# Patient Record
Sex: Male | Born: 1965 | Hispanic: No | Marital: Married | Smoking: Never smoker
Health system: Southern US, Community
[De-identification: ages and names within clinical notes are randomized; demographics above are authoritative.]

## PROBLEM LIST (undated history)

## (undated) DIAGNOSIS — M1990 Unspecified osteoarthritis, unspecified site: Secondary | ICD-10-CM

## (undated) DIAGNOSIS — T7840XA Allergy, unspecified, initial encounter: Secondary | ICD-10-CM

## (undated) DIAGNOSIS — L02419 Cutaneous abscess of limb, unspecified: Secondary | ICD-10-CM

## (undated) DIAGNOSIS — E079 Disorder of thyroid, unspecified: Secondary | ICD-10-CM

## (undated) DIAGNOSIS — F419 Anxiety disorder, unspecified: Secondary | ICD-10-CM

## (undated) HISTORY — DX: Disorder of thyroid, unspecified: E07.9

## (undated) HISTORY — DX: Cutaneous abscess of limb, unspecified: L02.419

## (undated) HISTORY — DX: Allergy, unspecified, initial encounter: T78.40XA

---

## 1989-11-20 HISTORY — PX: TONSILLECTOMY: SUR1361

## 2003-11-12 ENCOUNTER — Encounter: Admission: RE | Admit: 2003-11-12 | Discharge: 2003-11-12 | Payer: Self-pay | Admitting: Family Medicine

## 2010-12-10 ENCOUNTER — Encounter: Payer: Self-pay | Admitting: Family Medicine

## 2010-12-12 ENCOUNTER — Encounter: Payer: Self-pay | Admitting: Family Medicine

## 2011-06-08 ENCOUNTER — Encounter (INDEPENDENT_AMBULATORY_CARE_PROVIDER_SITE_OTHER): Payer: Self-pay | Admitting: General Surgery

## 2011-06-09 ENCOUNTER — Encounter (INDEPENDENT_AMBULATORY_CARE_PROVIDER_SITE_OTHER): Payer: Self-pay | Admitting: General Surgery

## 2011-06-13 ENCOUNTER — Ambulatory Visit (INDEPENDENT_AMBULATORY_CARE_PROVIDER_SITE_OTHER): Payer: BC Managed Care – PPO | Admitting: General Surgery

## 2011-06-13 ENCOUNTER — Encounter (INDEPENDENT_AMBULATORY_CARE_PROVIDER_SITE_OTHER): Payer: Self-pay | Admitting: General Surgery

## 2011-06-13 VITALS — BP 134/102 | HR 88 | Temp 97.8°F

## 2011-06-13 DIAGNOSIS — L03119 Cellulitis of unspecified part of limb: Secondary | ICD-10-CM

## 2011-06-13 DIAGNOSIS — L02415 Cutaneous abscess of right lower limb: Secondary | ICD-10-CM

## 2011-06-13 NOTE — Patient Instructions (Signed)
Resume normal activities and a few habitus small injury clean at all from applied topical antibiotic ointment or bed.  A few developed an infection start taken to [facility] but call for an appointment to be seen in the urgent office. Antibiotics are helpful but abscess requires and the I&D

## 2011-06-13 NOTE — Progress Notes (Signed)
Subjective:     Patient ID: Darren Lewis., male   DOB: 1966/11/07, 45 y.o.   MRN: [id number]  HPI [name] returns he is a patient that I sawapproximately 2 months ago when he had an abscess drained in the right thigh it did grow MRSA area he originally been on dotched him ove renewed to [facility] and he completed that approximately 2 weeks ago there is no evidence of any open wound or drainage or tenderness at this time because has a little sterile redness from the healing phase of his previous large abscess   Review of Systems No changes in his review of systems he is not started [place].     Objective:   Physical Exam Examination is limited to the right lower extremity shows a very small healing area with no drainage and no tenderness full subcutaneous mass did not think he developed any inguinal lymphadenopathy in the area appears healed .  I discussed with him that some people might consider a nose culture looking for MRSA wait until he has another problem infection before ordering that because of the cost. I did give him a prescription for [facility] that he will not get filled but if he does have another problem infection we'll start antibiotics and call to be seen in an urgent office promptly     Assessment:         Plan:     Turn to see [place] if her health as needed if you do get a small abrasion or injury cleaned off his feet with Betadine and Neosporin as you would do an injury.

## 2011-06-16 ENCOUNTER — Encounter (INDEPENDENT_AMBULATORY_CARE_PROVIDER_SITE_OTHER): Payer: Self-pay | Admitting: General Surgery

## 2011-11-20 ENCOUNTER — Ambulatory Visit (INDEPENDENT_AMBULATORY_CARE_PROVIDER_SITE_OTHER): Payer: BC Managed Care – PPO

## 2011-11-20 DIAGNOSIS — J40 Bronchitis, not specified as acute or chronic: Secondary | ICD-10-CM

## 2011-11-20 DIAGNOSIS — R5381 Other malaise: Secondary | ICD-10-CM

## 2011-11-20 DIAGNOSIS — R05 Cough: Secondary | ICD-10-CM

## 2011-11-21 HISTORY — PX: INCISION AND DRAINAGE ABSCESS / HEMATOMA OF BURSA / KNEE / THIGH: SUR668

## 2018-03-12 ENCOUNTER — Ambulatory Visit: Payer: Self-pay | Admitting: Orthopedic Surgery

## 2018-04-17 ENCOUNTER — Ambulatory Visit: Payer: Self-pay | Admitting: Orthopedic Surgery

## 2018-04-17 NOTE — H&P (Signed)
TOTAL KNEE ADMISSION H&P  Patient is being admitted for left total knee arthroplasty.  Subjective:  Chief Complaint:left knee pain.  HPI: Darren Vayda., 52 y.o. male, has a history of pain and functional disability in the left knee due to arthritis and has failed non-surgical conservative treatments for greater than 12 weeks to includeNSAID's and/or analgesics, corticosteriod injections, flexibility and strengthening excercises, use of assistive devices, weight reduction as appropriate and activity modification.  Onset of symptoms was gradual, starting >10 years ago with gradually worsening course since that time. The patient noted no past surgery on the left knee(s).  Patient currently rates pain in the left knee(s) at 10 out of 10 with activity. Patient has night pain, worsening of pain with activity and weight bearing, pain that interferes with activities of daily living, pain with passive range of motion, crepitus and joint swelling.  Patient has evidence of subchondral cysts, subchondral sclerosis, periarticular osteophytes and joint space narrowing by imaging studies.  There is no active infection.  There are no active problems to display for this patient.  Past Medical History:  Diagnosis Date  . Allergy   . Leg abscess   . Thyroid disease     Past Surgical History:  Procedure Laterality Date  . INCISION AND DRAINAGE ABSCESS / HEMATOMA OF BURSA / KNEE / THIGH     right  . TONSILLECTOMY      Current Outpatient Medications  Medication Sig Dispense Refill Last Dose  . doxycycline (DORYX) 100 MG DR capsule Take 100 mg by mouth 2 (two) times daily.     Not Taking   No current facility-administered medications for this visit.    No Known Allergies  Social History   Tobacco Use  . Smoking status: Never Smoker  . Smokeless tobacco: Never Used  Substance Use Topics  . Alcohol use: Yes    Alcohol/week: 0.0 oz    Types: 2 - 3 Cans of beer per week    No family history on  file.   Review of Systems  Constitutional: Negative.   HENT: Negative.   Eyes: Negative.   Respiratory: Negative.   Cardiovascular: Negative.   Gastrointestinal: Negative.   Genitourinary: Negative.   Musculoskeletal: Positive for joint pain.  Skin: Negative.   Neurological: Negative.   Endo/Heme/Allergies: Negative.   Psychiatric/Behavioral: Negative.     Objective:  Physical Exam  Vitals reviewed. Constitutional: He is oriented to person, place, and time. He appears well-developed and well-nourished.  HENT:  Head: Normocephalic and atraumatic.  Eyes: Pupils are equal, round, and reactive to light. Conjunctivae and EOM are normal.  Neck: Normal range of motion. Neck supple.  Cardiovascular: Normal rate, regular rhythm and intact distal pulses.  Respiratory: Effort normal. No respiratory distress.  GI: Soft. He exhibits no distension.  Genitourinary:  Genitourinary Comments: deferred  Musculoskeletal:       Left knee: He exhibits decreased range of motion, swelling, effusion and abnormal alignment. Tenderness found. Medial joint line and lateral joint line tenderness noted.  Neurological: He is alert and oriented to person, place, and time. He has normal reflexes.  Skin: Skin is warm and dry.  Psychiatric: He has a normal mood and affect. His behavior is normal. Judgment and thought content normal.    Vital signs in last 24 hours: @  Labs:   There is no height or weight on file to calculate BMI.   Imaging Review Plain radiographs demonstrate severe degenerative joint disease of the left knee(s). The overall  alignment issignificant valgus. The bone quality appears to be adequate for age and reported activity level.   Preoperative templating of the joint replacement has been completed, documented, and submitted to the Operating Room personnel in order to optimize intra-operative equipment management.    Patient's anticipated LOS is less than 2 midnights,  meeting these requirements: - Younger than 65 - Lives within 1 hour of care - Has a competent adult at home to recover with post-op recover - NO history of  - Chronic pain requiring opiods  - Diabetes  - Coronary Artery Disease  - Heart failure  - Heart attack  - Stroke  - DVT/VTE  - Cardiac arrhythmia  - Respiratory Failure/COPD  - Renal failure  - Anemia  - Advanced Liver disease        Assessment/Plan:  End stage arthritis, left knee   The patient history, physical examination, clinical judgment of the provider and imaging studies are consistent with end stage degenerative joint disease of the left knee(s) and total knee arthroplasty is deemed medically necessary. The treatment options including medical management, injection therapy arthroscopy and arthroplasty were discussed at length. The risks and benefits of total knee arthroplasty were presented and reviewed. The risks due to aseptic loosening, infection, stiffness, patella tracking problems, thromboembolic complications and other imponderables were discussed. The patient acknowledged the explanation, agreed to proceed with the plan and consent was signed. Patient is being admitted for inpatient treatment for surgery, pain control, PT, OT, prophylactic antibiotics, VTE prophylaxis, progressive ambulation and ADL's and discharge planning. The patient is planning to be discharged home with outpatient PT at [facility]. Rx for DME given.  

## 2018-04-17 NOTE — H&P (View-Only) (Signed)
TOTAL KNEE ADMISSION H&P  Patient is being admitted for left total knee arthroplasty.  Subjective:  Chief Complaint:left knee pain.  HPI: Darren Vayda., 52 y.o. male, has a history of pain and functional disability in the left knee due to arthritis and has failed non-surgical conservative treatments for greater than 12 weeks to includeNSAID's and/or analgesics, corticosteriod injections, flexibility and strengthening excercises, use of assistive devices, weight reduction as appropriate and activity modification.  Onset of symptoms was gradual, starting >10 years ago with gradually worsening course since that time. The patient noted no past surgery on the left knee(s).  Patient currently rates pain in the left knee(s) at 10 out of 10 with activity. Patient has night pain, worsening of pain with activity and weight bearing, pain that interferes with activities of daily living, pain with passive range of motion, crepitus and joint swelling.  Patient has evidence of subchondral cysts, subchondral sclerosis, periarticular osteophytes and joint space narrowing by imaging studies.  There is no active infection.  There are no active problems to display for this patient.  Past Medical History:  Diagnosis Date  . Allergy   . Leg abscess   . Thyroid disease     Past Surgical History:  Procedure Laterality Date  . INCISION AND DRAINAGE ABSCESS / HEMATOMA OF BURSA / KNEE / THIGH     right  . TONSILLECTOMY      Current Outpatient Medications  Medication Sig Dispense Refill Last Dose  . doxycycline (DORYX) 100 MG DR capsule Take 100 mg by mouth 2 (two) times daily.     Not Taking   No current facility-administered medications for this visit.    No Known Allergies  Social History   Tobacco Use  . Smoking status: Never Smoker  . Smokeless tobacco: Never Used  Substance Use Topics  . Alcohol use: Yes    Alcohol/week: 0.0 oz    Types: 2 - 3 Cans of beer per week    No family history on  file.   Review of Systems  Constitutional: Negative.   HENT: Negative.   Eyes: Negative.   Respiratory: Negative.   Cardiovascular: Negative.   Gastrointestinal: Negative.   Genitourinary: Negative.   Musculoskeletal: Positive for joint pain.  Skin: Negative.   Neurological: Negative.   Endo/Heme/Allergies: Negative.   Psychiatric/Behavioral: Negative.     Objective:  Physical Exam  Vitals reviewed. Constitutional: He is oriented to person, place, and time. He appears well-developed and well-nourished.  HENT:  Head: Normocephalic and atraumatic.  Eyes: Pupils are equal, round, and reactive to light. Conjunctivae and EOM are normal.  Neck: Normal range of motion. Neck supple.  Cardiovascular: Normal rate, regular rhythm and intact distal pulses.  Respiratory: Effort normal. No respiratory distress.  GI: Soft. He exhibits no distension.  Genitourinary:  Genitourinary Comments: deferred  Musculoskeletal:       Left knee: He exhibits decreased range of motion, swelling, effusion and abnormal alignment. Tenderness found. Medial joint line and lateral joint line tenderness noted.  Neurological: He is alert and oriented to person, place, and time. He has normal reflexes.  Skin: Skin is warm and dry.  Psychiatric: He has a normal mood and affect. His behavior is normal. Judgment and thought content normal.    Vital signs in last 24 hours: @  Labs:   There is no height or weight on file to calculate BMI.   Imaging Review Plain radiographs demonstrate severe degenerative joint disease of the left knee(s). The overall  alignment issignificant valgus. The bone quality appears to be adequate for age and reported activity level.   Preoperative templating of the joint replacement has been completed, documented, and submitted to the Operating Room personnel in order to optimize intra-operative equipment management.    Patient's anticipated LOS is less than 2 midnights,  meeting these requirements: - Younger than 64 - Lives within 1 hour of care - Has a competent adult at home to recover with post-op recover - NO history of  - Chronic pain requiring opiods  - Diabetes  - Coronary Artery Disease  - Heart failure  - Heart attack  - Stroke  - DVT/VTE  - Cardiac arrhythmia  - Respiratory Failure/COPD  - Renal failure  - Anemia  - Advanced Liver disease        Assessment/Plan:  End stage arthritis, left knee   The patient history, physical examination, clinical judgment of the provider and imaging studies are consistent with end stage degenerative joint disease of the left knee(s) and total knee arthroplasty is deemed medically necessary. The treatment options including medical management, injection therapy arthroscopy and arthroplasty were discussed at length. The risks and benefits of total knee arthroplasty were presented and reviewed. The risks due to aseptic loosening, infection, stiffness, patella tracking problems, thromboembolic complications and other imponderables were discussed. The patient acknowledged the explanation, agreed to proceed with the plan and consent was signed. Patient is being admitted for inpatient treatment for surgery, pain control, PT, OT, prophylactic antibiotics, VTE prophylaxis, progressive ambulation and ADL's and discharge planning. The patient is planning to be discharged home with outpatient PT at [facility]. Rx for DME given.

## 2018-05-02 NOTE — Patient Instructions (Signed)
Tyrone Sagehomas S Kainz Jr.  05/02/2018   Your procedure is scheduled on: 05-09-18   [facility]    Report to admitting at 5:30AM    Call this number if you have problems the morning of surgery [phone]     Remember: Do not eat food or drink liquids :After Midnight.     Take these medicines the morning of surgery with A SIP OF WATER: LEVOTHYROXINE                                 You may not have any metal on your body including hair pins and              piercings  Do not wear jewelry, make-up, lotions, powders or perfumes, deodorant                   Men may shave face and neck.   Do not bring valuables to the hospital. [facility] IS NOT             RESPONSIBLE   FOR VALUABLES.  Contacts, dentures or bridgework may not be worn into surgery.  Leave suitcase in the car. After surgery it may be brought to your room.                Please read over the following fact sheets you were given: _____________________________________________________________________             [facility] - Preparing [facility] Before surgery, you can play an important role.  Because skin is not sterile, your skin needs to be as free of germs as possible.  You can reduce the number of germs on your skin by washing with CHG (chlorahexidine gluconate) soap before surgery.  CHG is an antiseptic cleaner which kills germs and bonds with the skin to continue killing germs even after washing. Please DO NOT use if you have an allergy to CHG or antibacterial soaps.  If your skin becomes reddened/irritated stop using the CHG and inform your nurse when you arrive at Short Stay. Do not shave (including legs and underarms) for at least 48 hours prior to the first CHG shower.  You may shave your face/neck. Please follow these instructions carefully:  1.  Shower with CHG Soap the night before surgery and the  morning of Surgery.  2.  If you choose to wash your hair, wash  your hair first as usual with your  normal  shampoo.  3.  After you shampoo, rinse your hair and body thoroughly to remove the  shampoo.                           4.  Use CHG as you would any other liquid soap.  You can apply chg directly  to the skin and wash                       Gently with a scrungie or clean washcloth.  5.  Apply the CHG Soap to your body ONLY FROM THE NECK DOWN.   Do not use on face/ open                           Wound  or open sores. Avoid contact with eyes, ears mouth and genitals (private parts).                       Wash face,  Genitals (private parts) with your normal soap.             6.  Wash thoroughly, paying special attention to the area where your surgery  will be performed.  7.  Thoroughly rinse your body with warm water from the neck down.  8.  DO NOT shower/wash with your normal soap after using and rinsing off  the CHG Soap.                9.  Pat yourself dry with a clean towel.            10.  Wear clean pajamas.            11.  Place clean sheets on your bed the night of your first shower and do not  sleep with pets. Day of Surgery : Do not apply any lotions/deodorants the morning of surgery.  Please wear clean clothes to the hospital/surgery center.  FAILURE TO FOLLOW THESE INSTRUCTIONS MAY RESULT IN THE CANCELLATION OF YOUR SURGERY PATIENT SIGNATURE_________________________________  NURSE SIGNATURE__________________________________  ________________________________________________________________________   Adam Phenix  An incentive spirometer is a tool that can help keep your lungs clear and active. This tool measures how well you are filling your lungs with each breath. Taking long deep breaths may help reverse or decrease the chance of developing breathing (pulmonary) problems (especially infection) following:  A long period of time when you are unable to move or be active. BEFORE THE PROCEDURE   If the spirometer includes an  indicator to show your best effort, your nurse or respiratory therapist will set it to a desired goal.  If possible, sit up straight or lean slightly forward. Try not to slouch.  Hold the incentive spirometer in an upright position. INSTRUCTIONS FOR USE  1. Sit on the edge of your bed if possible, or sit up as far as you can in bed or on a chair. 2. Hold the incentive spirometer in an upright position. 3. Breathe out normally. 4. Place the mouthpiece in your mouth and seal your lips tightly around it. 5. Breathe in slowly and as deeply as possible, raising the piston or the ball toward the top of the column. 6. Hold your breath for 3-5 seconds or for as long as possible. Allow the piston or ball to fall to the bottom of the column. 7. Remove the mouthpiece from your mouth and breathe out normally. 8. Rest for a few seconds and repeat Steps 1 through 7 at least 10 times every 1-2 hours when you are awake. Take your time and take a few normal breaths between deep breaths. 9. The spirometer may include an indicator to show your best effort. Use the indicator as a goal to work toward during each repetition. 10. After each set of 10 deep breaths, practice coughing to be sure your lungs are clear. If you have an incision (the cut made at the time of surgery), support your incision when coughing by placing a pillow or rolled up towels firmly against it. Once you are able to get out of bed, walk around indoors and cough well. You may stop using the incentive spirometer when instructed by your caregiver.  RISKS AND COMPLICATIONS  Take your time so you do not get dizzy  or light-headed.  If you are in pain, you may need to take or ask for pain medication before doing incentive spirometry. It is harder to take a deep breath if you are having pain. AFTER USE  Rest and breathe slowly and easily.  It can be helpful to keep track of a log of your progress. Your caregiver can provide you with a simple table  to help with this. If you are using the spirometer at home, follow these instructions: [place] IF:   You are having difficultly using the spirometer.  You have trouble using the spirometer as often as instructed.  Your pain medication is not giving enough relief while using the spirometer.  You develop fever of 100.5 F (38.1 C) or higher. SEEK IMMEDIATE MEDICAL CARE IF:   You cough up bloody sputum that had not been present before.  You develop fever of 102 F (38.9 C) or greater.  You develop worsening pain at or near the incision site. MAKE SURE YOU:   Understand these instructions.  Will watch your condition.  Will get help right away if you are not doing well or get worse. Document Released: 03/19/2007 Document Revised: 01/29/2012 Document Reviewed: 05/20/2007 [employer] Patient Information 2014 [employer], [place].   ________________________________________________________________________  WHAT IS A BLOOD TRANSFUSION? Blood Transfusion Information  A transfusion is the replacement of blood or some of its parts. Blood is made up of multiple cells which provide different functions.  Red blood cells carry oxygen and are used for blood loss replacement.  White blood cells fight against infection.  Platelets control bleeding.  Plasma helps clot blood.  Other blood products are available for specialized needs, such as hemophilia or other clotting disorders. BEFORE THE TRANSFUSION  Who gives blood for transfusions?   Healthy volunteers who are fully evaluated to make sure their blood is safe. This is blood bank blood. Transfusion therapy is the safest it has ever been in the practice of medicine. Before blood is taken from a donor, a complete history is taken to make sure that person has no history of diseases nor engages in risky social behavior (examples are intravenous drug use or sexual activity with multiple partners). The donor's travel history is screened  to minimize risk of transmitting infections, such as malaria. The donated blood is tested for signs of infectious diseases, such as HIV and hepatitis. The blood is then tested to be sure it is compatible with you in order to minimize the chance of a transfusion reaction. If you or a relative donates blood, this is often done in anticipation of surgery and is not appropriate for emergency situations. It takes many days to process the donated blood. RISKS AND COMPLICATIONS Although transfusion therapy is very safe and saves many lives, the main dangers of transfusion include:   Getting an infectious disease.  Developing a transfusion reaction. This is an allergic reaction to something in the blood you were given. Every precaution is taken to prevent this. The decision to have a blood transfusion has been considered carefully by your caregiver before blood is given. Blood is not given unless the benefits outweigh the risks. AFTER THE TRANSFUSION  Right after receiving a blood transfusion, you will usually feel much better and more energetic. This is especially true if your red blood cells have gotten low (anemic). The transfusion raises the level of the red blood cells which carry oxygen, and this usually causes an energy increase.  The nurse administering the transfusion will monitor  you carefully for complications. HOME CARE INSTRUCTIONS  No special instructions are needed after a transfusion. You may find your energy is better. Speak with your caregiver about any limitations on activity for underlying diseases you may have. SEEK MEDICAL CARE IF:   Your condition is not improving after your transfusion.  You develop redness or irritation at the intravenous (IV) site. SEEK IMMEDIATE MEDICAL CARE IF:  Any of the following symptoms occur over the next 12 hours:  Shaking chills.  You have a temperature by mouth above 102 F (38.9 C), not controlled by medicine.  Chest, back, or muscle  pain.  People around you feel you are not acting correctly or are confused.  Shortness of breath or difficulty breathing.  Dizziness and fainting.  You get a rash or develop hives.  You have a decrease in urine output.  Your urine turns a dark color or changes to pink, red, or brown. Any of the following symptoms occur over the next 10 days:  You have a temperature by mouth above 102 F (38.9 C), not controlled by medicine.  Shortness of breath.  Weakness after normal activity.  The white part of the eye turns yellow (jaundice).  You have a decrease in the amount of urine or are urinating less often.  Your urine turns a dark color or changes to pink, red, or brown. Document Released: 11/03/2000 Document Revised: 01/29/2012 Document Reviewed: 06/22/2008 [facility] Patient Information 2014 [place], [place].  _______________________________________________________________________

## 2018-05-03 ENCOUNTER — Encounter (HOSPITAL_COMMUNITY): Payer: Self-pay

## 2018-05-03 ENCOUNTER — Other Ambulatory Visit: Payer: Self-pay

## 2018-05-03 ENCOUNTER — Encounter (HOSPITAL_COMMUNITY)
Admission: RE | Admit: 2018-05-03 | Discharge: 2018-05-03 | Disposition: A | Discharge status: Home / Self Care | Payer: BC Managed Care – PPO | Source: Ambulatory Visit | Attending: Orthopedic Surgery | Admitting: Orthopedic Surgery

## 2018-05-03 DIAGNOSIS — M1712 Unilateral primary osteoarthritis, left knee: Secondary | ICD-10-CM | POA: Insufficient documentation

## 2018-05-03 DIAGNOSIS — Z01818 Encounter for other preprocedural examination: Secondary | ICD-10-CM | POA: Insufficient documentation

## 2018-05-03 HISTORY — DX: Anxiety disorder, unspecified: F41.9

## 2018-05-03 HISTORY — DX: Unspecified osteoarthritis, unspecified site: M19.90

## 2018-05-03 LAB — CBC
HEMATOCRIT: 38.5 % — AB (ref 39.0–52.0)
HEMOGLOBIN: 12.6 g/dL — AB (ref 13.0–17.0)
MCH: 29 pg (ref 26.0–34.0)
MCHC: 32.7 g/dL (ref 30.0–36.0)
MCV: 88.5 fL (ref 78.0–100.0)
Platelets: 223 10*3/uL (ref 150–400)
RBC: 4.35 MIL/uL (ref 4.22–5.81)
RDW: 13.5 % (ref 11.5–15.5)
WBC: 5.6 10*3/uL (ref 4.0–10.5)

## 2018-05-03 LAB — SURGICAL PCR SCREEN
MRSA, PCR: NEGATIVE
STAPHYLOCOCCUS AUREUS: POSITIVE — AB

## 2018-05-03 LAB — ABO/RH: ABO/RH(D): A POS

## 2018-05-08 MED ORDER — TRANEXAMIC ACID 1000 MG/10ML IV SOLN
1000.0000 mg | INTRAVENOUS | Status: AC
  Administered 2018-05-09: 1000 mg via INTRAVENOUS
  Filled 2018-05-08: qty 1100

## 2018-05-08 NOTE — Anesthesia Preprocedure Evaluation (Addendum)
Anesthesia Evaluation  Patient identified by MRN, date of birth, ID band Patient awake    Reviewed: Allergy & Precautions, NPO status , Patient's Chart, lab work & pertinent test results  Airway Mallampati: II  TM Distance: >3 FB Neck ROM: Full    Dental no notable dental hx.    Pulmonary neg pulmonary ROS,    Pulmonary exam normal breath sounds clear to auscultation       Cardiovascular negative cardio ROS Normal cardiovascular exam Rhythm:Regular Rate:Normal     Neuro/Psych PSYCHIATRIC DISORDERS Anxiety negative neurological ROS     GI/Hepatic negative GI ROS, Neg liver ROS,   Endo/Other  negative endocrine ROS  Renal/GU negative Renal ROS     Musculoskeletal  (+) Arthritis ,   Abdominal   Peds  Hematology negative hematology ROS (+)   Anesthesia Other Findings   Reproductive/Obstetrics negative OB ROS                             Anesthesia Physical Anesthesia Plan  ASA: II  Anesthesia Plan: Spinal   Post-op Pain Management:  Regional for Post-op pain   Induction: Intravenous  PONV Risk Score and Plan: 2 and Ondansetron, Propofol infusion and Midazolam  Airway Management Planned:   Additional Equipment:   Intra-op Plan:   Post-operative Plan:   Informed Consent: I have reviewed the patients History and Physical, chart, labs and discussed the procedure including the risks, benefits and alternatives for the proposed anesthesia with the patient or authorized representative who has indicated his/her understanding and acceptance.   Dental advisory given  Plan Discussed with: CRNA  Anesthesia Plan Comments:         Anesthesia Quick Evaluation

## 2018-05-09 ENCOUNTER — Encounter (HOSPITAL_COMMUNITY): Payer: Self-pay | Admitting: *Deleted

## 2018-05-09 ENCOUNTER — Other Ambulatory Visit: Payer: Self-pay

## 2018-05-09 ENCOUNTER — Inpatient Hospital Stay (HOSPITAL_COMMUNITY): Payer: BC Managed Care – PPO | Admitting: Anesthesiology

## 2018-05-09 ENCOUNTER — Observation Stay (HOSPITAL_COMMUNITY)
Admission: RE | Admit: 2018-05-09 | Discharge: 2018-05-10 | Disposition: A | Discharge status: Home Health | Payer: BC Managed Care – PPO | Source: Ambulatory Visit | Attending: Orthopedic Surgery | Admitting: Orthopedic Surgery

## 2018-05-09 ENCOUNTER — Inpatient Hospital Stay (HOSPITAL_COMMUNITY): Payer: BC Managed Care – PPO

## 2018-05-09 ENCOUNTER — Encounter (HOSPITAL_COMMUNITY)
Admission: RE | Disposition: A | Discharge status: Home Health | Payer: Self-pay | Source: Ambulatory Visit | Attending: Orthopedic Surgery

## 2018-05-09 DIAGNOSIS — R269 Unspecified abnormalities of gait and mobility: Secondary | ICD-10-CM | POA: Insufficient documentation

## 2018-05-09 DIAGNOSIS — E079 Disorder of thyroid, unspecified: Secondary | ICD-10-CM | POA: Diagnosis not present

## 2018-05-09 DIAGNOSIS — Z79899 Other long term (current) drug therapy: Secondary | ICD-10-CM | POA: Diagnosis not present

## 2018-05-09 DIAGNOSIS — F419 Anxiety disorder, unspecified: Secondary | ICD-10-CM | POA: Insufficient documentation

## 2018-05-09 DIAGNOSIS — M1712 Unilateral primary osteoarthritis, left knee: Principal | ICD-10-CM | POA: Diagnosis present

## 2018-05-09 DIAGNOSIS — Z96652 Presence of left artificial knee joint: Secondary | ICD-10-CM

## 2018-05-09 HISTORY — PX: KNEE ARTHROPLASTY: SHX992

## 2018-05-09 LAB — TYPE AND SCREEN
ABO/RH(D): A POS
Antibody Screen: NEGATIVE

## 2018-05-09 SURGERY — ARTHROPLASTY, KNEE, TOTAL, USING IMAGELESS COMPUTER-ASSISTED NAVIGATION
Anesthesia: Spinal | Site: Knee | Laterality: Left

## 2018-05-09 MED ORDER — KETOROLAC TROMETHAMINE 15 MG/ML IJ SOLN
15.0000 mg | Freq: Four times a day (QID) | INTRAMUSCULAR | Status: AC
  Administered 2018-05-09 (×3): 15 mg via INTRAVENOUS
  Filled 2018-05-09 (×4): qty 1

## 2018-05-09 MED ORDER — SODIUM CHLORIDE 0.9 % IJ SOLN
INTRAMUSCULAR | Status: DC | PRN
  Administered 2018-05-09: 30 mL

## 2018-05-09 MED ORDER — PROPOFOL 500 MG/50ML IV EMUL
INTRAVENOUS | Status: DC | PRN
  Administered 2018-05-09: 75 ug/kg/min via INTRAVENOUS

## 2018-05-09 MED ORDER — KETOROLAC TROMETHAMINE 30 MG/ML IJ SOLN
INTRAMUSCULAR | Status: AC
  Filled 2018-05-09: qty 1

## 2018-05-09 MED ORDER — 0.9 % SODIUM CHLORIDE (POUR BTL) OPTIME
TOPICAL | Status: DC | PRN
  Administered 2018-05-09: 1000 mL

## 2018-05-09 MED ORDER — MIDAZOLAM HCL 5 MG/5ML IJ SOLN
INTRAMUSCULAR | Status: DC | PRN
  Administered 2018-05-09: 1 mg via INTRAVENOUS
  Administered 2018-05-09: 2 mg via INTRAVENOUS
  Administered 2018-05-09: 1 mg via INTRAVENOUS

## 2018-05-09 MED ORDER — ISOPROPYL ALCOHOL 70 % SOLN
Status: DC | PRN
  Administered 2018-05-09: 1 via TOPICAL

## 2018-05-09 MED ORDER — ROPIVACAINE HCL 5 MG/ML IJ SOLN
INTRAMUSCULAR | Status: DC | PRN
  Administered 2018-05-09: 30 mL

## 2018-05-09 MED ORDER — HYDROMORPHONE HCL 1 MG/ML IJ SOLN: 0.2500 mg | INTRAMUSCULAR | Status: DC | PRN

## 2018-05-09 MED ORDER — FENTANYL CITRATE (PF) 100 MCG/2ML IJ SOLN
INTRAMUSCULAR | Status: DC | PRN
  Administered 2018-05-09: 50 ug via INTRAVENOUS

## 2018-05-09 MED ORDER — ONDANSETRON HCL 4 MG PO TABS: 4.0000 mg | ORAL_TABLET | Freq: Four times a day (QID) | ORAL | Status: DC | PRN

## 2018-05-09 MED ORDER — DIPHENHYDRAMINE HCL 12.5 MG/5ML PO ELIX
12.5000 mg | ORAL_SOLUTION | ORAL | Status: DC | PRN
  Administered 2018-05-10: 25 mg via ORAL
  Filled 2018-05-09: qty 10

## 2018-05-09 MED ORDER — ALUM & MAG HYDROXIDE-SIMETH 200-200-20 MG/5ML PO SUSP: 30.0000 mL | ORAL | Status: DC | PRN

## 2018-05-09 MED ORDER — CEFAZOLIN SODIUM-DEXTROSE 2-4 GM/100ML-% IV SOLN
2.0000 g | INTRAVENOUS | Status: AC
  Administered 2018-05-09: 2 g via INTRAVENOUS
  Filled 2018-05-09: qty 100

## 2018-05-09 MED ORDER — HYDROCODONE-ACETAMINOPHEN 5-325 MG PO TABS
1.0000 | ORAL_TABLET | ORAL | Status: DC | PRN
  Administered 2018-05-09: 1 via ORAL
  Filled 2018-05-09: qty 1

## 2018-05-09 MED ORDER — MIDAZOLAM HCL 2 MG/2ML IJ SOLN
INTRAMUSCULAR | Status: AC
  Filled 2018-05-09: qty 2

## 2018-05-09 MED ORDER — PHENOL 1.4 % MT LIQD: 1.0000 | OROMUCOSAL | Status: DC | PRN

## 2018-05-09 MED ORDER — PHENYLEPHRINE HCL 10 MG/ML IJ SOLN
INTRAMUSCULAR | Status: AC
  Filled 2018-05-09: qty 1

## 2018-05-09 MED ORDER — BUPIVACAINE-EPINEPHRINE 0.25% -1:200000 IJ SOLN
INTRAMUSCULAR | Status: DC | PRN
  Administered 2018-05-09: 30 mL

## 2018-05-09 MED ORDER — DEXAMETHASONE SODIUM PHOSPHATE 10 MG/ML IJ SOLN
INTRAMUSCULAR | Status: AC
  Filled 2018-05-09: qty 1

## 2018-05-09 MED ORDER — METHOCARBAMOL 1000 MG/10ML IJ SOLN
500.0000 mg | Freq: Four times a day (QID) | INTRAVENOUS | Status: DC | PRN
  Administered 2018-05-09 (×2): 500 mg via INTRAVENOUS
  Filled 2018-05-09 (×2): qty 550

## 2018-05-09 MED ORDER — KETOROLAC TROMETHAMINE 30 MG/ML IJ SOLN: 30.0000 mg | Freq: Once | INTRAMUSCULAR | Status: DC | PRN

## 2018-05-09 MED ORDER — BUPIVACAINE-EPINEPHRINE (PF) 0.25% -1:200000 IJ SOLN
INTRAMUSCULAR | Status: AC
  Filled 2018-05-09: qty 30

## 2018-05-09 MED ORDER — STERILE WATER FOR IRRIGATION IR SOLN
Status: DC | PRN
  Administered 2018-05-09: 2000 mL

## 2018-05-09 MED ORDER — FENTANYL CITRATE (PF) 100 MCG/2ML IJ SOLN
INTRAMUSCULAR | Status: AC
  Filled 2018-05-09: qty 2

## 2018-05-09 MED ORDER — METOCLOPRAMIDE HCL 5 MG/ML IJ SOLN: 5.0000 mg | Freq: Three times a day (TID) | INTRAMUSCULAR | Status: DC | PRN

## 2018-05-09 MED ORDER — PROMETHAZINE HCL 25 MG/ML IJ SOLN: 6.2500 mg | INTRAMUSCULAR | Status: DC | PRN

## 2018-05-09 MED ORDER — ACETAMINOPHEN 10 MG/ML IV SOLN
1000.0000 mg | INTRAVENOUS | Status: DC
  Filled 2018-05-09: qty 100

## 2018-05-09 MED ORDER — HYDROCODONE-ACETAMINOPHEN 7.5-325 MG PO TABS
1.0000 | ORAL_TABLET | ORAL | Status: DC | PRN
  Administered 2018-05-09: 2 via ORAL
  Administered 2018-05-09: 1 via ORAL
  Administered 2018-05-10 (×3): 2 via ORAL
  Filled 2018-05-09 (×4): qty 2
  Filled 2018-05-09: qty 1

## 2018-05-09 MED ORDER — KETOROLAC TROMETHAMINE 30 MG/ML IJ SOLN
INTRAMUSCULAR | Status: DC | PRN
  Administered 2018-05-09: 30 mg

## 2018-05-09 MED ORDER — LACTATED RINGERS IV SOLN
INTRAVENOUS | Status: DC
  Administered 2018-05-09 (×3): via INTRAVENOUS

## 2018-05-09 MED ORDER — BUPIVACAINE IN DEXTROSE 0.75-8.25 % IT SOLN
INTRATHECAL | Status: DC | PRN
  Administered 2018-05-09: 2 mL via INTRATHECAL

## 2018-05-09 MED ORDER — DOCUSATE SODIUM 100 MG PO CAPS
100.0000 mg | ORAL_CAPSULE | Freq: Two times a day (BID) | ORAL | Status: DC
  Administered 2018-05-09 – 2018-05-10 (×2): 100 mg via ORAL
  Filled 2018-05-09 (×2): qty 1

## 2018-05-09 MED ORDER — MEPERIDINE HCL 50 MG/ML IJ SOLN: 6.2500 mg | INTRAMUSCULAR | Status: DC | PRN

## 2018-05-09 MED ORDER — ONDANSETRON HCL 4 MG/2ML IJ SOLN
INTRAMUSCULAR | Status: AC
  Filled 2018-05-09: qty 2

## 2018-05-09 MED ORDER — METHOCARBAMOL 500 MG PO TABS
500.0000 mg | ORAL_TABLET | Freq: Four times a day (QID) | ORAL | Status: DC | PRN
  Administered 2018-05-10: 500 mg via ORAL
  Filled 2018-05-09: qty 1

## 2018-05-09 MED ORDER — ISOPROPYL ALCOHOL 70 % SOLN
Status: AC
  Filled 2018-05-09: qty 480

## 2018-05-09 MED ORDER — METOCLOPRAMIDE HCL 5 MG PO TABS: 5.0000 mg | ORAL_TABLET | Freq: Three times a day (TID) | ORAL | Status: DC | PRN

## 2018-05-09 MED ORDER — CHLORHEXIDINE GLUCONATE 4 % EX LIQD: 60.0000 mL | Freq: Once | CUTANEOUS | Status: DC

## 2018-05-09 MED ORDER — ONDANSETRON HCL 4 MG/2ML IJ SOLN
INTRAMUSCULAR | Status: DC | PRN
  Administered 2018-05-09: 4 mg via INTRAVENOUS

## 2018-05-09 MED ORDER — POVIDONE-IODINE 10 % EX SWAB
2.0000 "application " | Freq: Once | CUTANEOUS | Status: AC
  Administered 2018-05-09: 2 via TOPICAL

## 2018-05-09 MED ORDER — POLYETHYLENE GLYCOL 3350 17 G PO PACK
17.0000 g | PACK | Freq: Every day | ORAL | Status: DC | PRN
  Administered 2018-05-10: 17 g via ORAL
  Filled 2018-05-09: qty 1

## 2018-05-09 MED ORDER — DEXAMETHASONE SODIUM PHOSPHATE 10 MG/ML IJ SOLN
10.0000 mg | Freq: Once | INTRAMUSCULAR | Status: AC
  Administered 2018-05-10: 10 mg via INTRAVENOUS
  Filled 2018-05-09: qty 1

## 2018-05-09 MED ORDER — PROPOFOL 10 MG/ML IV BOLUS
INTRAVENOUS | Status: AC
Start: 2018-05-09 — End: ?
  Filled 2018-05-09: qty 40

## 2018-05-09 MED ORDER — PROPOFOL 10 MG/ML IV BOLUS
INTRAVENOUS | Status: AC
  Filled 2018-05-09: qty 20

## 2018-05-09 MED ORDER — ASPIRIN 81 MG PO CHEW
81.0000 mg | CHEWABLE_TABLET | Freq: Two times a day (BID) | ORAL | Status: DC
  Administered 2018-05-09 – 2018-05-10 (×2): 81 mg via ORAL
  Filled 2018-05-09 (×2): qty 1

## 2018-05-09 MED ORDER — PHENYLEPHRINE HCL 10 MG/ML IJ SOLN
INTRAMUSCULAR | Status: DC | PRN
  Administered 2018-05-09: 25 ug/min via INTRAVENOUS

## 2018-05-09 MED ORDER — SODIUM CHLORIDE 0.9 % IV SOLN: INTRAVENOUS | Status: DC

## 2018-05-09 MED ORDER — SODIUM CHLORIDE 0.9 % IJ SOLN
INTRAMUSCULAR | Status: AC
  Filled 2018-05-09: qty 50

## 2018-05-09 MED ORDER — ACETAMINOPHEN 325 MG PO TABS: 325.0000 mg | ORAL_TABLET | Freq: Four times a day (QID) | ORAL | Status: DC | PRN

## 2018-05-09 MED ORDER — CEFAZOLIN SODIUM-DEXTROSE 2-4 GM/100ML-% IV SOLN
2.0000 g | Freq: Four times a day (QID) | INTRAVENOUS | Status: AC
  Administered 2018-05-09 (×2): 2 g via INTRAVENOUS
  Filled 2018-05-09 (×2): qty 100

## 2018-05-09 MED ORDER — ONDANSETRON HCL 4 MG/2ML IJ SOLN
4.0000 mg | Freq: Four times a day (QID) | INTRAMUSCULAR | Status: DC | PRN
  Administered 2018-05-09 (×2): 4 mg via INTRAVENOUS
  Filled 2018-05-09 (×2): qty 2

## 2018-05-09 MED ORDER — MORPHINE SULFATE (PF) 2 MG/ML IV SOLN
0.5000 mg | INTRAVENOUS | Status: DC | PRN
  Administered 2018-05-09 (×2): 1 mg via INTRAVENOUS
  Filled 2018-05-09 (×2): qty 1

## 2018-05-09 MED ORDER — MENTHOL 3 MG MT LOZG: 1.0000 | LOZENGE | OROMUCOSAL | Status: DC | PRN

## 2018-05-09 MED ORDER — SODIUM CHLORIDE 0.9 % IR SOLN
Status: DC | PRN
  Administered 2018-05-09: 4000 mL

## 2018-05-09 MED ORDER — SODIUM CHLORIDE 0.9 % IV SOLN
INTRAVENOUS | Status: DC
  Administered 2018-05-09 – 2018-05-10 (×3): via INTRAVENOUS

## 2018-05-09 MED ORDER — SENNA 8.6 MG PO TABS
1.0000 | ORAL_TABLET | Freq: Two times a day (BID) | ORAL | Status: DC
  Administered 2018-05-09 – 2018-05-10 (×2): 8.6 mg via ORAL
  Filled 2018-05-09 (×2): qty 1

## 2018-05-09 MED ORDER — LEVOTHYROXINE SODIUM 75 MCG PO TABS
75.0000 ug | ORAL_TABLET | Freq: Every day | ORAL | Status: DC
  Administered 2018-05-10: 75 ug via ORAL
  Filled 2018-05-09: qty 1

## 2018-05-09 SURGICAL SUPPLY — 65 items
BAG ZIPLOCK 12X15 (MISCELLANEOUS) ×3 IMPLANT
BANDAGE ACE 4X5 VEL STRL LF (GAUZE/BANDAGES/DRESSINGS) ×3 IMPLANT
BANDAGE ACE 6X5 VEL STRL LF (GAUZE/BANDAGES/DRESSINGS) ×3 IMPLANT
BATTERY INSTRU NAVIGATION (MISCELLANEOUS) ×9 IMPLANT
BLADE SAW RECIPROCATING 77.5 (BLADE) ×3 IMPLANT
CHLORAPREP W/TINT 26ML (MISCELLANEOUS) ×6 IMPLANT
COMP FEMORAL TRIATHLON LFT SZ7 (Orthopedic Implant) ×3 IMPLANT
COMPONENT FEMRL TRTHLON LT SZ7 (Orthopedic Implant) ×1 IMPLANT
COVER SURGICAL LIGHT HANDLE (MISCELLANEOUS) ×3 IMPLANT
CUFF TOURN SGL QUICK 34 (TOURNIQUET CUFF) ×2
CUFF TRNQT CYL 34X4X40X1 (TOURNIQUET CUFF) ×1 IMPLANT
DECANTER SPIKE VIAL GLASS SM (MISCELLANEOUS) ×3 IMPLANT
DERMABOND ADVANCED (GAUZE/BANDAGES/DRESSINGS) ×4
DERMABOND ADVANCED .7 DNX12 (GAUZE/BANDAGES/DRESSINGS) ×2 IMPLANT
DRAPE SHEET LG 3/4 BI-LAMINATE (DRAPES) ×9 IMPLANT
DRAPE U-SHAPE 47X51 STRL (DRAPES) ×3 IMPLANT
DRSG AQUACEL AG ADV 3.5X10 (GAUZE/BANDAGES/DRESSINGS) ×3 IMPLANT
DRSG TEGADERM 4X4.75 (GAUZE/BANDAGES/DRESSINGS) IMPLANT
ELECT BLADE TIP CTD 4 INCH (ELECTRODE) ×3 IMPLANT
ELECT REM PT RETURN 15FT ADLT (MISCELLANEOUS) ×3 IMPLANT
EVACUATOR 1/8 PVC DRAIN (DRAIN) IMPLANT
GAUZE SPONGE 4X4 12PLY STRL (GAUZE/BANDAGES/DRESSINGS) ×3 IMPLANT
GLOVE BIO SURGEON STRL SZ8.5 (GLOVE) ×15 IMPLANT
GLOVE BIOGEL PI IND STRL 6.5 (GLOVE) ×1 IMPLANT
GLOVE BIOGEL PI IND STRL 7.0 (GLOVE) ×2 IMPLANT
GLOVE BIOGEL PI IND STRL 8.5 (GLOVE) ×1 IMPLANT
GLOVE BIOGEL PI INDICATOR 6.5 (GLOVE) ×2
GLOVE BIOGEL PI INDICATOR 7.0 (GLOVE) ×4
GLOVE BIOGEL PI INDICATOR 8.5 (GLOVE) ×2
GLOVE SURG SS PI 7.0 STRL IVOR (GLOVE) ×3 IMPLANT
GOWN SPEC L3 XXLG W/TWL (GOWN DISPOSABLE) ×3 IMPLANT
GOWN STRL REUS W/ TWL LRG LVL3 (GOWN DISPOSABLE) ×1 IMPLANT
GOWN STRL REUS W/TWL LRG LVL3 (GOWN DISPOSABLE) ×2
GOWN STRL REUS W/TWL XL LVL4 (GOWN DISPOSABLE) ×3 IMPLANT
HANDPIECE INTERPULSE COAX TIP (DISPOSABLE) ×2
HOOD PEEL AWAY FLYTE STAYCOOL (MISCELLANEOUS) ×9 IMPLANT
KNEE INSERT TIB BRG 6 (Insert) ×3 IMPLANT
KNEE TIBIAL COMPONENT SZ6 (Knees) ×3 IMPLANT
MARKER SKIN DUAL TIP RULER LAB (MISCELLANEOUS) ×3 IMPLANT
NEEDLE SPNL 18GX3.5 QUINCKE PK (NEEDLE) ×3 IMPLANT
NS IRRIG 1000ML POUR BTL (IV SOLUTION) ×3 IMPLANT
PACK TOTAL KNEE CUSTOM (KITS) ×3 IMPLANT
PADDING CAST COTTON 6X4 STRL (CAST SUPPLIES) ×3 IMPLANT
PATELLA ASYMMETRIC 38X11 KNEE (Orthopedic Implant) ×3 IMPLANT
POSITIONER SURGICAL ARM (MISCELLANEOUS) ×3 IMPLANT
SAW OSC TIP CART 19.5X105X1.3 (SAW) ×3 IMPLANT
SEALER BIPOLAR AQUA 6.0 (INSTRUMENTS) ×3 IMPLANT
SET HNDPC FAN SPRY TIP SCT (DISPOSABLE) ×1 IMPLANT
SET PAD KNEE POSITIONER (MISCELLANEOUS) ×3 IMPLANT
SPONGE DRAIN TRACH 4X4 STRL 2S (GAUZE/BANDAGES/DRESSINGS) IMPLANT
SPONGE LAP 18X18 RF (DISPOSABLE) IMPLANT
SUT MNCRL AB 3-0 PS2 18 (SUTURE) ×3 IMPLANT
SUT MON AB 2-0 CT1 36 (SUTURE) ×6 IMPLANT
SUT STRATAFIX PDO 1 14 VIOLET (SUTURE) ×2
SUT STRATFX PDO 1 14 VIOLET (SUTURE) ×1
SUT VIC AB 1 CT1 36 (SUTURE) ×9 IMPLANT
SUT VIC AB 2-0 CT1 27 (SUTURE) ×2
SUT VIC AB 2-0 CT1 TAPERPNT 27 (SUTURE) ×1 IMPLANT
SUTURE STRATFX PDO 1 14 VIOLET (SUTURE) ×1 IMPLANT
SYR 50ML LL SCALE MARK (SYRINGE) IMPLANT
TOWER CARTRIDGE SMART MIX (DISPOSABLE) IMPLANT
TRAY FOLEY MTR SLVR 16FR STAT (SET/KITS/TRAYS/PACK) ×3 IMPLANT
WATER STERILE IRR 1000ML POUR (IV SOLUTION) ×6 IMPLANT
WRAP KNEE MAXI GEL POST OP (GAUZE/BANDAGES/DRESSINGS) ×3 IMPLANT
YANKAUER SUCT BULB TIP 10FT TU (MISCELLANEOUS) ×3 IMPLANT

## 2018-05-09 NOTE — Anesthesia Procedure Notes (Signed)
Date/Time: 05/09/2018 7:43 AM Performed by: Thornell MuleStubblefield, Maddy Graham G, CRNA Oxygen Delivery Method: Simple face mask

## 2018-05-09 NOTE — Anesthesia Procedure Notes (Signed)
Spinal  Patient location during procedure: OR Start time: 05/09/2018 7:48 AM End time: 05/09/2018 7:51 AM Staffing Anesthesiologist: Nolon Nations, MD Resident/CRNA: Glory Buff, CRNA Performed: resident/CRNA  Preanesthetic Checklist Completed: patient identified, site marked, surgical consent, pre-op evaluation, timeout performed, IV checked, risks and benefits discussed and monitors and equipment checked Spinal Block Patient position: sitting Prep: ChloraPrep Patient monitoring: heart rate, continuous pulse ox and blood pressure Approach: midline Location: L3-4 Injection technique: single-shot Needle Needle type: Pencan  Needle gauge: 24 G Needle length: 9 cm Needle insertion depth: 6 cm Assessment Sensory level: T6 Additional Notes Kit date checked and verified.  Skin local with 1% xylocaine, stick x 1, - paresthesia, - heme, + CSF pre and post injection, patient tolerated well.

## 2018-05-09 NOTE — Discharge Instructions (Signed)
° °Dr. Aleisa Howk °Total Joint Specialist °[facility] °[address] °[place], [place] [address] °[phone] ° °TOTAL KNEE REPLACEMENT POSTOPERATIVE DIRECTIONS ° ° ° °Knee Rehabilitation, Guidelines Following Surgery  °Results after knee surgery are often greatly improved when you follow the exercise, range of motion and muscle strengthening exercises prescribed by your doctor. Safety measures are also important to protect the knee from further injury. Any time any of these exercises cause you to have increased pain or swelling in your knee joint, decrease the amount until you are comfortable again and slowly increase them. If you have problems or questions, call your caregiver or physical therapist for advice.  ° °WEIGHT BEARING °Weight bearing as tolerated with assist device (walker, cane, etc) as directed, use it as long as suggested by your surgeon or therapist, typically at least 4-6 weeks. ° °HOME CARE INSTRUCTIONS  °Remove items at home which could result in a fall. This includes throw rugs or furniture in walking pathways.  °Continue medications as instructed at time of discharge. °You may have some home medications which will be placed on hold until you complete the course of blood thinner medication.  °You may start showering once you are discharged home but do not submerge the incision under water. Just pat the incision dry and apply a dry gauze dressing on daily. °Walk with walker as instructed.  °You may resume a sexual relationship in one month or when given the OK by your doctor.  °· Use walker as long as suggested by your caregivers. °· Avoid periods of inactivity such as sitting longer than an hour when not asleep. This helps prevent blood clots.  °You may put full weight on your legs and walk as much as is comfortable.  °You may return to work once you are cleared by your doctor.  °Do not drive a car for 6 weeks or until released by you surgeon.  °· Do not drive  while taking narcotics.  °Wear the elastic stockings for three weeks following surgery during the day but you may remove then at night. °Make sure you keep all of your appointments after your operation with all of your doctors and caregivers. You should call the office at the above phone number and make an appointment for approximately two weeks after the date of your surgery. °Do not remove your surgical dressing. The dressing is waterproof; you may take showers in 3 days, but do not take tub baths or submerge the dressing. °Please pick up a stool softener and laxative for home use as long as you are requiring pain medications. °· ICE to the affected knee every three hours for 30 minutes at a time and then as needed for pain and swelling.  Continue to use ice on the knee for pain and swelling from surgery. You may notice swelling that will progress down to the foot and ankle.  This is normal after surgery.  Elevate the leg when you are not up walking on it.   °It is important for you to complete the blood thinner medication as prescribed by your doctor. °· Continue to use the breathing machine which will help keep your temperature down.  It is common for your temperature to cycle up and down following surgery, especially at night when you are not up moving around and exerting yourself.  The breathing machine keeps your lungs expanded and your temperature down. ° °RANGE OF MOTION AND STRENGTHENING EXERCISES  °Rehabilitation of the knee is important following   a knee injury or an operation. After just a few days of immobilization, the muscles of the thigh which control the knee become weakened and shrink (atrophy). Knee exercises are designed to build up the tone and strength of the thigh muscles and to improve knee motion. Often times heat used for twenty to thirty minutes before working out will loosen up your tissues and help with improving the range of motion but do not use heat for the first two weeks following  surgery. These exercises can be done on a training (exercise) mat, on the floor, on a table or on a bed. Use what ever works the best and is most comfortable for you Knee exercises include:  °Leg Lifts - While your knee is still immobilized in a splint or cast, you can do straight leg raises. Lift the leg to 60 degrees, hold for 3 sec, and slowly lower the leg. Repeat 10-20 times 2-3 times daily. Perform this exercise against resistance later as your knee gets better.  °Quad and Hamstring Sets - Tighten up the muscle on the front of the thigh (Quad) and hold for 5-10 sec. Repeat this 10-20 times hourly. Hamstring sets are done by pushing the foot backward against an object and holding for 5-10 sec. Repeat as with quad sets.  °A rehabilitation program following serious knee injuries can speed recovery and prevent re-injury in the future due to weakened muscles. Contact your doctor or a physical therapist for more information on knee rehabilitation.  ° °SKILLED REHAB INSTRUCTIONS: °If the patient is transferred to a skilled rehab facility following release from the hospital, a list of the current medications will be sent to the facility for the patient to continue.  When discharged from the skilled rehab facility, please have the facility set up the patient's Home Health Physical Therapy prior to being released. Also, the skilled facility will be responsible for providing the patient with their medications at time of release from the facility to include their pain medication, the muscle relaxants, and their blood thinner medication. If the patient is still at the rehab facility at time of the two week follow up appointment, the skilled rehab facility will also need to assist the patient in arranging follow up appointment in our office and any transportation needs. ° °MAKE SURE YOU:  °Understand these instructions.  °Will watch your condition.  °Will get help right away if you are not doing well or get worse.   ° ° °Pick up stool softner and laxative for home use following surgery while on pain medications. °Do NOT remove your dressing. You may shower.  °Do not take tub baths or submerge incision under water. °May shower starting three days after surgery. °Please use a clean towel to pat the incision dry following showers. °Continue to use ice for pain and swelling after surgery. °Do not use any lotions or creams on the incision until instructed by your surgeon. ° °

## 2018-05-09 NOTE — Evaluation (Signed)
Physical Therapy Evaluation Patient Details Name: Darren Sagehomas S Rachal Jr. MRN: [id number] DOB: 1966/11/19 Today's Date: 05/09/2018   History of Present Illness  Pt s/p L TKR  Clinical Impression  Pt s/p L TKR and presents with decreased L LE strength/ROM and post op pain limiting functional mobility.  Pt should progress to dc home with family assist    Follow Up Recommendations Follow surgeon's recommendation for DC plan and follow-up therapies    Equipment Recommendations  None recommended by PT    Recommendations for Other Services       Precautions / Restrictions Precautions Precautions: Fall Restrictions Weight Bearing Restrictions: No Other Position/Activity Restrictions: WBAT      Mobility  Bed Mobility Overal bed mobility: Needs Assistance Bed Mobility: Sit to Supine       Sit to supine: Min assist   General bed mobility comments: cues for sequence and use of R LE to self assist  Transfers Overall transfer level: Needs assistance Equipment used: Rolling walker (2 wheeled) Transfers: Sit to/from Stand Sit to Stand: Min assist         General transfer comment: cues for LE management and use of UEs to self assist  Ambulation/Gait Ambulation/Gait assistance: Min assist Gait Distance (Feet): 5 Feet Assistive device: Rolling walker (2 wheeled) Gait Pattern/deviations: Step-to pattern;Decreased step length - right;Decreased step length - left;Shuffle;Trunk flexed Gait velocity: decr   General Gait Details: cues for sequence, posture and position from RW - distance ltd by nausea and vomiting  Stairs            Wheelchair Mobility    Modified Rankin (Stroke Patients Only)       Balance                                             Pertinent Vitals/Pain Pain Assessment: 0-10 Pain Score: 6  Pain Location: L knee Pain Descriptors / Indicators: Aching;Sore Pain Intervention(s): Limited activity within patient's tolerance;Monitored  during session;Premedicated before session;Ice applied    Home Living Family/patient expects to be discharged to:: Private residence Living Arrangements: Spouse/significant other Available Help at Discharge: Family Type of Home: House Home Access: Stairs to enter Entrance Stairs-Rails: Right Entrance Stairs-Number of Steps: 2 Home Layout: Two level Home Equipment: Environmental consultantWalker - 2 wheels;Walker - 4 wheels;Cane - single point      Prior Function Level of Independence: Independent               Hand Dominance        Extremity/Trunk Assessment   Upper Extremity Assessment Upper Extremity Assessment: Overall WFL for tasks assessed    Lower Extremity Assessment Lower Extremity Assessment: LLE deficits/detail    Cervical / Trunk Assessment Cervical / Trunk Assessment: Normal  Communication   Communication: No difficulties  Cognition Arousal/Alertness: Awake/alert Behavior During Therapy: WFL for tasks assessed/performed Overall Cognitive Status: Within Functional Limits for tasks assessed                                        General Comments      Exercises Total Joint Exercises Ankle Circles/Pumps: AROM;Both;15 reps;Supine   Assessment/Plan    PT Assessment Patient needs continued PT services  PT Problem List Decreased strength;Decreased range of motion;Decreased activity tolerance;Decreased mobility;Decreased knowledge of use of DME;Pain  PT Treatment Interventions DME instruction;Gait training;Stair training;Functional mobility training;Therapeutic activities;Therapeutic exercise;Patient/family education    PT Goals (Current goals can be found in the Care Plan section)  Acute Rehab PT Goals Patient Stated Goal: Regain IND PT Goal Formulation: With patient Time For Goal Achievement: 05/16/18 Potential to Achieve Goals: Good    Frequency 7X/week   Barriers to discharge        Co-evaluation               AM-PAC PT "6  Clicks" Daily Activity  Outcome Measure Difficulty turning over in bed (including adjusting bedclothes, sheets and blankets)?: Unable Difficulty moving from lying on back to sitting on the side of the bed? : Unable Difficulty sitting down on and standing up from a chair with arms (e.g., wheelchair, bedside commode, etc,.)?: Unable Help needed moving to and from a bed to chair (including a wheelchair)?: A Little Help needed walking in hospital room?: A Little Help needed climbing 3-5 steps with a railing? : A Lot 6 Click Score: 11    End of Session Equipment Utilized During Treatment: Gait belt Activity Tolerance: Other (comment)(nausea and vomiting) Patient left: in chair;with call bell/phone within reach;with family/visitor present Nurse Communication: Mobility status;Other (comment)(nausea) PT Visit Diagnosis: Difficulty in walking, not elsewhere classified (R26.2)    Time: 1610-9604 PT Time Calculation (min) (ACUTE ONLY): 22 min   Charges:   PT Evaluation $PT Eval Low Complexity: 1 Low     PT G Codes:        Pg [phone]   Manvir Thorson 05/09/2018, 5:51 PM

## 2018-05-09 NOTE — Transfer of Care (Signed)
Immediate Anesthesia Transfer of Care Note  Patient: Darren Lewis.  Procedure(s) Performed: LEFT TOTAL KNEE ARTHROPLASTY WITH COMPUTER NAVIGATION (Left Knee)  Patient Location: PACU  Anesthesia Type:MAC and Spinal  Level of Consciousness: awake, alert  and oriented  Airway & Oxygen Therapy: Patient Spontanous Breathing and Patient connected to face mask oxygen  Post-op Assessment: Report given to RN and Post -op Vital signs reviewed and stable  Post vital signs: Reviewed and stable  Last Vitals:  Vitals Value Taken Time  BP    Temp    Pulse 87 05/09/2018 10:44 AM  Resp 16 05/09/2018 10:44 AM  SpO2 97 % 05/09/2018 10:44 AM  Vitals shown include unvalidated device data.  Last Pain:  Vitals:   05/09/18 0543  TempSrc: Oral      Patients Stated Pain Goal: 5 (05/20/15 0109)  Complications: No apparent anesthesia complications

## 2018-05-09 NOTE — Op Note (Signed)
OPERATIVE REPORT  SURGEON: Samson FredericBrian Keyvin Rison, MD   ASSISTANT: Velta AddisonShareen Davenport, RNFA.  PREOPERATIVE DIAGNOSIS: Left knee arthritis.   POSTOPERATIVE DIAGNOSIS: Left knee arthritis.   PROCEDURE: Left total knee arthroplasty.   IMPLANTS: Stryker Triathlon CR femur, size 7. Stryker Tritanium tibia, size 6. X3 polyethelyene insert, size 9 mm, CR. 3 button asymmetric patella, size 38 mm.  ANESTHESIA:  Regional and Spinal  TOURNIQUET TIME: Not utilized.   ESTIMATED BLOOD LOSS:-300 mL    ANTIBIOTICS: 2 g Ancef.  DRAINS: None.  COMPLICATIONS: None   CONDITION: PACU - hemodynamically stable.   BRIEF CLINICAL NOTE: Darren Sagehomas S [place] Jr. is a 52 y.o. male with a long-standing history of Left knee arthritis. After failing conservative management, the patient was indicated for total knee arthroplasty. The risks, benefits, and alternatives to the procedure were explained, and the patient elected to proceed.  PROCEDURE IN DETAIL: Adductor canal block was obtained in the pre-op holding area. Once inside the operative room, spinal anesthesia was obtained, and a foley catheter was inserted. The patient was then positioned, a nonsterile tourniquet was placed, and the lower extremity was prepped and draped in the normal sterile surgical fashion. A time-out was called verifying side and site of surgery. The patient received IV antibiotics within 60 minutes of beginning the procedure. The tourniquet was not utilized.  An anterior approach to the knee was performed utilizing a midvastus arthrotomy. A medial release was performed and the patellar fat pad was excised. Stryker navigation was used to cut the distal femur perpendicular to the mechanical axis. A freehand patellar resection was performed, and the patella was sized an prepared with 3 lug holes.  Nagivation was used to make a neutral proximal tibia  resection, taking 6 mm of bone from the less affected medial side with 3 degrees of slope. The menisci were excised. A spacer block was placed, and the alignment and balance in extension were confirmed.   The distal femur was sized using the 3-degree external rotation guide referencing the posterior femoral cortex. The appropriate 4-in-1 cutting block was pinned into place. Rotation was checked using Whiteside's line, the epicondylar axis, and then confirmed with a spacer block in flexion. The remaining femoral cuts were performed, taking care to protect the MCL.  The tibia was sized and the trial tray was pinned into place. The remaining trail components were inserted. The knee was stable to varus and valgus stress through a full range of motion. The patella tracked centrally, and the PCL was well balanced. The trial components were removed, and the proximal tibial surface was prepared. Final components were impacted into place. The knee was tested for a final time and found to be well balanced.  The wound was copiously irrigated with normal saline with pulse lavage. Marcaine solution was injected into the periarticular soft tissue. The wound was closed in layers using #1 Vicryl and Stratafix for the fascia, 2-0 Vicryl for the subcutaneous fat, 2-0 Monocryl for the deep dermal layer, 3-0 running Monocryl subcuticular Stitch, and Dermabond for the skin. Once the glue was fully dried, an Aquacell Ag and compressive dressing were applied. Tthe patient was transported to the recovery room in stable condition. Sponge, needle, and instrument counts were correct at the end of the case x2. The patient tolerated the procedure well and there were no known complications.

## 2018-05-09 NOTE — Interval H&P Note (Signed)
History and Physical Interval Note:  05/09/2018 7:38 AM  Darren Sagehomas S Coonan Jr.  has presented today for surgery, with the diagnosis of Degenerative joint disease left knee-valgus  The various methods of treatment have been discussed with the patient and family. After consideration of risks, benefits and other options for treatment, the patient has consented to  Procedure(s) with comments: LEFT TOTAL KNEE ARTHROPLASTY WITH COMPUTER NAVIGATION (Left) - Needs RNFA as a surgical intervention .  The patient's history has been reviewed, patient examined, no change in status, stable for surgery.  I have reviewed the patient's chart and labs.  Questions were answered to the patient's satisfaction.     Iline OvenBrian J Mical Kicklighter

## 2018-05-09 NOTE — Anesthesia Procedure Notes (Signed)
Anesthesia Regional Block: Adductor canal block   Pre-Anesthetic Checklist: ,, timeout performed, Correct Patient, Correct Site, Correct Laterality, Correct Procedure, Correct Position, site marked, Risks and benefits discussed,  Surgical consent,  Pre-op evaluation,  At surgeon's request and post-op pain management  Laterality: Left  Prep: chloraprep       Needles:  Injection technique: Single-shot  Needle Type: Stimiplex     Needle Length: 9cm  Needle Gauge: 21     Additional Needles:   Procedures:,,,, ultrasound used (permanent image in chart),,,,  Narrative:  Start time: 05/09/2018 7:16 AM End time: 05/09/2018 7:18 AM Injection made incrementally with aspirations every 5 mL.  Performed by: Personally  Anesthesiologist: Lewie LoronGermeroth, Amalia Edgecombe, MD  Additional Notes: BP cuff, EKG monitors applied. Sedation begun. Artery and nerve location verified with U/S and anesthetic injected incrementally, slowly, and after negative aspirations under direct u/s guidance. Good fascial /perineural spread. Tolerated well.

## 2018-05-10 ENCOUNTER — Encounter (HOSPITAL_COMMUNITY): Payer: Self-pay | Admitting: Orthopedic Surgery

## 2018-05-10 DIAGNOSIS — M1712 Unilateral primary osteoarthritis, left knee: Secondary | ICD-10-CM | POA: Diagnosis not present

## 2018-05-10 LAB — BASIC METABOLIC PANEL
Anion gap: 7 (ref 5–15)
BUN: 9 mg/dL (ref 6–20)
CHLORIDE: 107 mmol/L (ref 101–111)
CO2: 27 mmol/L (ref 22–32)
CREATININE: 0.69 mg/dL (ref 0.61–1.24)
Calcium: 8.3 mg/dL — ABNORMAL LOW (ref 8.9–10.3)
GFR calc Af Amer: >60 mL/min (ref >60)
GFR calc non Af Amer: >60 mL/min (ref >60)
Glucose, Bld: 128 mg/dL — ABNORMAL HIGH (ref 65–99)
POTASSIUM: 4 mmol/L (ref 3.5–5.1)
SODIUM: 141 mmol/L (ref 135–145)

## 2018-05-10 LAB — CBC
HCT: 31.9 % — ABNORMAL LOW (ref 39.0–52.0)
HEMOGLOBIN: 10.5 g/dL — AB (ref 13.0–17.0)
MCH: 29.4 pg (ref 26.0–34.0)
MCHC: 32.9 g/dL (ref 30.0–36.0)
MCV: 89.4 fL (ref 78.0–100.0)
Platelets: 191 10*3/uL (ref 150–400)
RBC: 3.57 MIL/uL — AB (ref 4.22–5.81)
RDW: 13.7 % (ref 11.5–15.5)
WBC: 6.9 10*3/uL (ref 4.0–10.5)

## 2018-05-10 MED ORDER — ONDANSETRON HCL 4 MG PO TABS: 4.0000 mg | ORAL_TABLET | Freq: Four times a day (QID) | ORAL | 0 refills | Status: AC | PRN

## 2018-05-10 MED ORDER — DOCUSATE SODIUM 100 MG PO CAPS: 100.0000 mg | ORAL_CAPSULE | Freq: Two times a day (BID) | ORAL | 1 refills | Status: AC

## 2018-05-10 MED ORDER — HYDROCODONE-ACETAMINOPHEN 5-325 MG PO TABS: 1.0000 | ORAL_TABLET | Freq: Four times a day (QID) | ORAL | 0 refills | Status: AC | PRN

## 2018-05-10 MED ORDER — ASPIRIN 81 MG PO CHEW: 81.0000 mg | CHEWABLE_TABLET | Freq: Two times a day (BID) | ORAL | 1 refills | Status: AC

## 2018-05-10 MED ORDER — SENNA 8.6 MG PO TABS: 1.0000 | ORAL_TABLET | Freq: Two times a day (BID) | ORAL | 0 refills | Status: AC

## 2018-05-10 NOTE — Discharge Summary (Signed)
Physician Discharge Summary  Patient ID: Darren Sagehomas S Mcmath Jr. MRN: [id number] DOB/AGE: [birth year] 54 y.o.  Admit date: 05/09/2018 Discharge date: 05/10/2018  Admission Diagnoses:  Osteoarthritis of left knee  Discharge Diagnoses:  Principal Problem:   Osteoarthritis of left knee   Past Medical History:  Diagnosis Date  . Allergy   . Anxiety   . Arthritis   . Leg abscess   . Thyroid disease     Surgeries: Procedure(s): LEFT TOTAL KNEE ARTHROPLASTY WITH COMPUTER NAVIGATION on 05/09/2018   Consultants (if any):   Discharged Condition: Improved  Hospital Course: Darren Sagehomas S Schiro Jr. is an 52 y.o. male who was admitted 05/09/2018 with a diagnosis of Osteoarthritis of left knee and went to the operating room on 05/09/2018 and underwent the above named procedures.    He was given perioperative antibiotics:  Anti-infectives (From admission, onward)   Start     Dose/Rate Route Frequency Ordered Stop   05/09/18 1400  ceFAZolin (ANCEF) IVPB 2g/100 mL premix     2 g 200 mL/hr over 30 Minutes Intravenous Every 6 hours 05/09/18 1216 05/09/18 2036   05/09/18 0600  ceFAZolin (ANCEF) IVPB 2g/100 mL premix     2 g 200 mL/hr over 30 Minutes Intravenous On call to O.R. 05/09/18 [id number] 05/09/18 [id number]    .  He was given sequential compression devices, early ambulation, and ASA for DVT prophylaxis.  He benefited maximally from the hospital stay and there were no complications.    Recent vital signs:  Vitals:   05/10/18 0548 05/10/18 0926  BP: 128/88 (!) 145/94  Pulse: 78 88  Resp: 16 16  Temp: 98.2 F (36.8 C)   SpO2: 98% 95%    Recent laboratory studies:  Lab Results  Component Value Date   HGB 10.5 (L) 05/10/2018   HGB 12.6 (L) 05/03/2018   Lab Results  Component Value Date   WBC 6.9 05/10/2018   PLT 191 05/10/2018   No results found for: INR Lab Results  Component Value Date   NA 141 05/10/2018   K 4.0 05/10/2018   CL 107 05/10/2018   CO2 27 05/10/2018   BUN 9 05/10/2018    CREATININE 0.69 05/10/2018   GLUCOSE 128 (H) 05/10/2018    Discharge Medications:   Allergies as of 05/10/2018   No Known Allergies     Medication List    STOP taking these medications   ENBREL 50 MG/ML injection Generic drug:  etanercept     TAKE these medications   aspirin 81 MG chewable tablet Chew 1 tablet (81 mg total) by mouth 2 (two) times daily.   docusate sodium 100 MG capsule Commonly known as:  COLACE Take 1 capsule (100 mg total) by mouth 2 (two) times daily.   Fish Oil 1200 MG Caps Take 1,200 mg by mouth daily.   HYDROcodone-acetaminophen 5-325 MG tablet Commonly known as:  NORCO/VICODIN Take 1-2 tablets by mouth every 6 (six) hours as needed (postop knee pain).   levothyroxine 75 MCG tablet Commonly known as:  SYNTHROID, LEVOTHROID Take 75 mcg by mouth daily before breakfast.   Melatonin 3 MG Caps Take 3 mg by mouth at bedtime as needed (for sleep).   MULTIVITAMIN ADULTS 50+ PO Take 1 tablet by mouth daily.   NON FORMULARY Apply 1 application topically daily as needed (for rosacea). Ketoconazole/Fluticasone 1:1 Cream 60 gm   ondansetron 4 MG tablet Commonly known as:  ZOFRAN Take 1 tablet (4 mg total) by mouth every 6 (six) hours  as needed for nausea.   OVER THE COUNTER MEDICATION Place 2 drops under the tongue daily as needed (for pain). Hemp Derived Oil 8.3 mg   senna 8.6 MG Tabs tablet Commonly known as:  SENOKOT Take 1 tablet (8.6 mg total) by mouth 2 (two) times daily.       Diagnostic Studies: Dg Knee Left Port  Result Date: 05/09/2018 CLINICAL DATA:  Status post left knee replacement EXAM: PORTABLE LEFT KNEE - 1-2 VIEW COMPARISON:  None. FINDINGS: Left knee prosthesis is noted. There is a linear density noted along the anterior aspect of femoral component superiorly which may represent a small bone fragment from the remodeling of the distal femur. Clinical correlation is recommended. No other focal abnormality is noted. IMPRESSION:  Linear density adjacent to the femoral component of the prosthesis as described. Clinical correlation is recommended. Electronically Signed   By: Alcide Clever M.D.   On: 05/09/2018 11:22    Disposition: Discharge disposition: 01-Home or Self Care       Discharge Instructions    Call MD / Call 911   Complete by:  As directed    If you experience chest pain or shortness of breath, CALL 911 and be transported to the hospital emergency room.  If you develope a fever above 101 F, pus (white drainage) or increased drainage or redness at the wound, or calf pain, call your surgeon's office.   Constipation Prevention   Complete by:  As directed    Drink plenty of fluids.  Prune juice may be helpful.  You may use a stool softener, such as Colace (over the counter) 100 mg twice a day.  Use MiraLax (over the counter) for constipation as needed.   Diet - low sodium heart healthy   Complete by:  As directed    Do not put a pillow under the knee. Place it under the heel.   Complete by:  As directed    Driving restrictions   Complete by:  As directed    No driving for 6 weeks   Increase activity slowly as tolerated   Complete by:  As directed    Lifting restrictions   Complete by:  As directed    No lifting for 6 weeks   TED hose   Complete by:  As directed    Use stockings (TED hose) for 2 weeks on both leg(s).  You may remove them at night for sleeping.      Follow-up Information    Darren Klimaszewski, [name], MD. Schedule an appointment as soon as possible for a visit in 2 weeks.   Specialty:  Orthopedic Surgery Why:  For wound re-check Contact information: [address] [place] [address] [phone]            Signed: [name] 05/10/2018, 9:50 AM

## 2018-05-10 NOTE — Progress Notes (Signed)
Physical Therapy Treatment Patient Details Name: Darren Sagehomas S Amyx Jr. MRN: [id number] DOB: [birth year] Today's Date: 05/10/2018    History of Present Illness Pt s/p L TKR    PT Comments    Pt very motivated and progressing well with mobility.   Follow Up Recommendations  Follow surgeon's recommendation for DC plan and follow-up therapies     Equipment Recommendations  None recommended by PT    Recommendations for Other Services       Precautions / Restrictions Precautions Precautions: Fall Restrictions Weight Bearing Restrictions: No Other Position/Activity Restrictions: WBAT    Mobility  Bed Mobility Overal bed mobility: Needs Assistance Bed Mobility: Sit to Supine       Sit to supine: Min assist   General bed mobility comments: cues for sequence and use of R LE to self assist  Transfers Overall transfer level: Needs assistance Equipment used: Rolling walker (2 wheeled) Transfers: Sit to/from Stand Sit to Stand: Min guard         General transfer comment: cues for LE management and use of UEs to self assist  Ambulation/Gait Ambulation/Gait assistance: Min assist;Min guard Gait Distance (Feet): 140 Feet Assistive device: Rolling walker (2 wheeled) Gait Pattern/deviations: Step-to pattern;Decreased step length - right;Decreased step length - left;Shuffle;Trunk flexed Gait velocity: decr   General Gait Details: cues for sequence, posture and position from RW - distance ltd by nausea and vomiting   Stairs             Wheelchair Mobility    Modified Rankin (Stroke Patients Only)       Balance Overall balance assessment: No apparent balance deficits (not formally assessed)                                          Cognition Arousal/Alertness: Awake/alert Behavior During Therapy: WFL for tasks assessed/performed Overall Cognitive Status: Within Functional Limits for tasks assessed                                         Exercises Total Joint Exercises Ankle Circles/Pumps: AROM;Both;15 reps;Supine Quad Sets: AROM;10 reps;Supine;Both Heel Slides: AAROM;15 reps;Left;Supine Straight Leg Raises: AAROM;AROM;Left;20 reps;Supine Goniometric ROM: AAROM L knee -10 - 80    General Comments        Pertinent Vitals/Pain Pain Assessment: 0-10 Pain Score: 5  Pain Location: L knee Pain Descriptors / Indicators: Aching;Sore Pain Intervention(s): Limited activity within patient's tolerance;Monitored during session;Premedicated before session;Ice applied    Home Living                      Prior Function            PT Goals (current goals can now be found in the care plan section) Acute Rehab PT Goals Patient Stated Goal: Regain IND PT Goal Formulation: With patient Time For Goal Achievement: 05/16/18 Potential to Achieve Goals: Good Progress towards PT goals: Progressing toward goals    Frequency    7X/week      PT Plan Current plan remains appropriate    Co-evaluation              AM-PAC PT "6 Clicks" Daily Activity  Outcome Measure  Difficulty turning over in bed (including adjusting bedclothes, sheets and blankets)?: A Lot Difficulty moving from lying  on back to sitting on the side of the bed? : A Lot Difficulty sitting down on and standing up from a chair with arms (e.g., wheelchair, bedside commode, etc,.)?: A Lot Help needed moving to and from a bed to chair (including a wheelchair)?: A Little Help needed walking in hospital room?: A Little Help needed climbing 3-5 steps with a railing? : A Little 6 Click Score: 15    End of Session Equipment Utilized During Treatment: Gait belt Activity Tolerance: Patient tolerated treatment well Patient left: in chair;with call bell/phone within reach;with family/visitor present Nurse Communication: Mobility status;Other (comment) PT Visit Diagnosis: Difficulty in walking, not elsewhere classified (R26.2)     Time:  1610-9604 PT Time Calculation (min) (ACUTE ONLY): 23 min  Charges:  $Gait Training: 8-22 mins $Therapeutic Exercise: 8-22 mins                    G Codes:       Pg [phone]    Farrel Guimond 05/10/2018, 12:16 PM

## 2018-05-10 NOTE — Progress Notes (Signed)
Nausea at beginning of shift resolved. Pain controlled. Vitals stable.

## 2018-05-10 NOTE — Progress Notes (Signed)
Physical Therapy Treatment Patient Details Name: Darren Lewis. MRN: [id number] DOB: October 20, 1966 Today's Date: 05/10/2018    History of Present Illness Pt s/p L TKR    PT Comments    Pt motivated and progressing well.  Spouse present to review home therex and stairs with written instruction provided.   Follow Up Recommendations  Follow surgeon's recommendation for DC plan and follow-up therapies     Equipment Recommendations  None recommended by PT    Recommendations for Other Services       Precautions / Restrictions Precautions Precautions: Fall Restrictions Weight Bearing Restrictions: No Other Position/Activity Restrictions: WBAT    Mobility  Bed Mobility Overal bed mobility: Needs Assistance Bed Mobility: Sit to Supine       Sit to supine: Min assist   General bed mobility comments: cues for sequence and use of R LE to self assist  Transfers Overall transfer level: Needs assistance Equipment used: Rolling walker (2 wheeled) Transfers: Sit to/from Stand Sit to Stand: Min guard;Supervision         General transfer comment: cues for LE management and use of UEs to self assist  Ambulation/Gait Ambulation/Gait assistance: Min guard;Supervision Gait Distance (Feet): 100 Feet Assistive device: Rolling walker (2 wheeled) Gait Pattern/deviations: Step-to pattern;Decreased step length - right;Decreased step length - left;Shuffle;Trunk flexed Gait velocity: decr   General Gait Details: cues for sequence, posture and position from RW - distance ltd by nausea and vomiting   Stairs Stairs: Yes Stairs assistance: Min assist Stair Management: One rail Right;Step to pattern;Forwards;With crutches Number of Stairs: 7 General stair comments: cues for sequence and foot/crutch placement,  Spouse assisting and written instruction provided.   Wheelchair Mobility    Modified Rankin (Stroke Patients Only)       Balance Overall balance assessment: No apparent  balance deficits (not formally assessed)                                          Cognition Arousal/Alertness: Awake/alert Behavior During Therapy: WFL for tasks assessed/performed Overall Cognitive Status: Within Functional Limits for tasks assessed                                        Exercises Total Joint Exercises Ankle Circles/Pumps: AROM;Both;15 reps;Supine Quad Sets: AROM;Supine;5 reps;Both Heel Slides: AAROM;15 reps;Supine;5 reps Straight Leg Raises: AAROM;AROM;Left;Supine;5 reps Knee Flexion: AROM;AAROM;Left;10 reps;Seated Goniometric ROM: AAROM L knee -10 - 80    General Comments        Pertinent Vitals/Pain Pain Assessment: 0-10 Pain Score: 5  Pain Location: L knee Pain Descriptors / Indicators: Aching;Sore Pain Intervention(s): Limited activity within patient's tolerance;Monitored during session;Premedicated before session;Ice applied    Home Living                      Prior Function            PT Goals (current goals can now be found in the care plan section) Acute Rehab PT Goals Patient Stated Goal: Regain IND PT Goal Formulation: With patient Time For Goal Achievement: 05/16/18 Potential to Achieve Goals: Good Progress towards PT goals: Progressing toward goals    Frequency    7X/week      PT Plan Current plan remains appropriate    Co-evaluation  AM-PAC PT "6 Clicks" Daily Activity  Outcome Measure  Difficulty turning over in bed (including adjusting bedclothes, sheets and blankets)?: A Lot Difficulty moving from lying on back to sitting on the side of the bed? : A Lot Difficulty sitting down on and standing up from a chair with arms (e.g., wheelchair, bedside commode, etc,.)?: A Lot Help needed moving to and from a bed to chair (including a wheelchair)?: A Little Help needed walking in hospital room?: A Little Help needed climbing 3-5 steps with a railing? : A Little 6  Click Score: 15    End of Session Equipment Utilized During Treatment: Gait belt Activity Tolerance: Patient tolerated treatment well Patient left: in chair;with call bell/phone within reach;with family/visitor present Nurse Communication: Mobility status;Other (comment) PT Visit Diagnosis: Difficulty in walking, not elsewhere classified (R26.2)     Time: [id number] PT Time Calculation (min) (ACUTE ONLY): 31 min  Charges:  $Gait Training: 8-22 mins $Therapeutic Exercise: 8-22 mins                    G Codes:       Pg [phone]    Darren Lewis 05/10/2018, 2:09 PM

## 2018-05-10 NOTE — Progress Notes (Signed)
Discharge instructions and prescriptions were given to and discussed with the pt and pt's wife. Pt and pt's wife report no further questions or concerns.

## 2018-05-10 NOTE — Anesthesia Postprocedure Evaluation (Signed)
Anesthesia Post Note  Patient: Darren Sagehomas S Ziff Jr.  Procedure(s) Performed: LEFT TOTAL KNEE ARTHROPLASTY WITH COMPUTER NAVIGATION (Left Knee)     Patient location during evaluation: PACU Anesthesia Type: Spinal Level of consciousness: awake and alert Pain management: pain level controlled Vital Signs Assessment: post-procedure vital signs reviewed and stable Respiratory status: spontaneous breathing and respiratory function stable Cardiovascular status: blood pressure returned to baseline and stable Postop Assessment: spinal receding Anesthetic complications: no    Last Vitals:  Vitals:   05/10/18 0212 05/10/18 0548  BP: 136/87 128/88  Pulse: 83 78  Resp: 16 16  Temp: 36.8 C 36.8 C  SpO2: 97% 98%    Last Pain:  Vitals:   05/10/18 0642  TempSrc:   PainSc: 6    Pain Goal: Patients Stated Pain Goal: 0 (05/10/18 [id number])               Lewie LoronJohn Melford Tullier

## 2018-05-10 NOTE — Progress Notes (Signed)
    Subjective:  Patient reports pain as mild to moderate.  Denies N/V/CP/SOB. No c/o.  Objective:   VITALS:   Vitals:   05/09/18 2324 05/10/18 0212 05/10/18 0548 05/10/18 0926  BP: 129/88 136/87 128/88 (!) 145/94  Pulse: 81 83 78 88  Resp: 16 16 16 16   Temp: 98.2 F (36.8 C) 98.2 F (36.8 C) 98.2 F (36.8 C)   TempSrc: Oral Oral Oral   SpO2: 97% 97% 98% 95%  Weight:      Height:        NAD ABD soft Sensation intact distally Intact pulses distally Dorsiflexion/Plantar flexion intact Incision: dressing C/D/I Compartment soft   Lab Results  Component Value Date   WBC 6.9 05/10/2018   HGB 10.5 (L) 05/10/2018   HCT 31.9 (L) 05/10/2018   MCV 89.4 05/10/2018   PLT 191 05/10/2018   BMET    Component Value Date/Time   NA 141 05/10/2018 0517   K 4.0 05/10/2018 0517   CL 107 05/10/2018 0517   CO2 27 05/10/2018 0517   GLUCOSE 128 (H) 05/10/2018 0517   BUN 9 05/10/2018 0517   CREATININE 0.69 05/10/2018 0517   CALCIUM 8.3 (L) 05/10/2018 0517   GFRNONAA >60 05/10/2018 0517   GFRAA >60 05/10/2018 0517    Patient's anticipated LOS is less than 2 midnights, meeting these requirements: - Younger than 4665 - Lives within 1 hour of care - Has a competent adult at home to recover with post-op recover - NO history of  - Chronic pain requiring opiods  - Diabetes  - Coronary Artery Disease  - Heart failure  - Heart attack  - Stroke  - DVT/VTE  - Cardiac arrhythmia  - Respiratory Failure/COPD  - Renal failure  - Anemia  - Advanced Liver disease     Assessment/Plan: 1 Day Post-Op   Principal Problem:   Osteoarthritis of left knee   WBAT with walker DVT ppx: Aspirin, SCDs, TEDS PO pain control PT/OT Dispo: D/C home with outpatient PT after clears therapy    Darren Lewis 05/10/2018, 9:47 AM   Darren FredericBrian Menucha Dicesare, MD Cell [phone]

## 2018-10-20 IMAGING — DX DG KNEE 1-2V PORT*L*
2 series · 2 of 2 positions shown · non-contrast
Comparison: None.

CLINICAL DATA: Status post left knee replacement

EXAM:
PORTABLE LEFT KNEE - 1-2 VIEW

[knee ap]
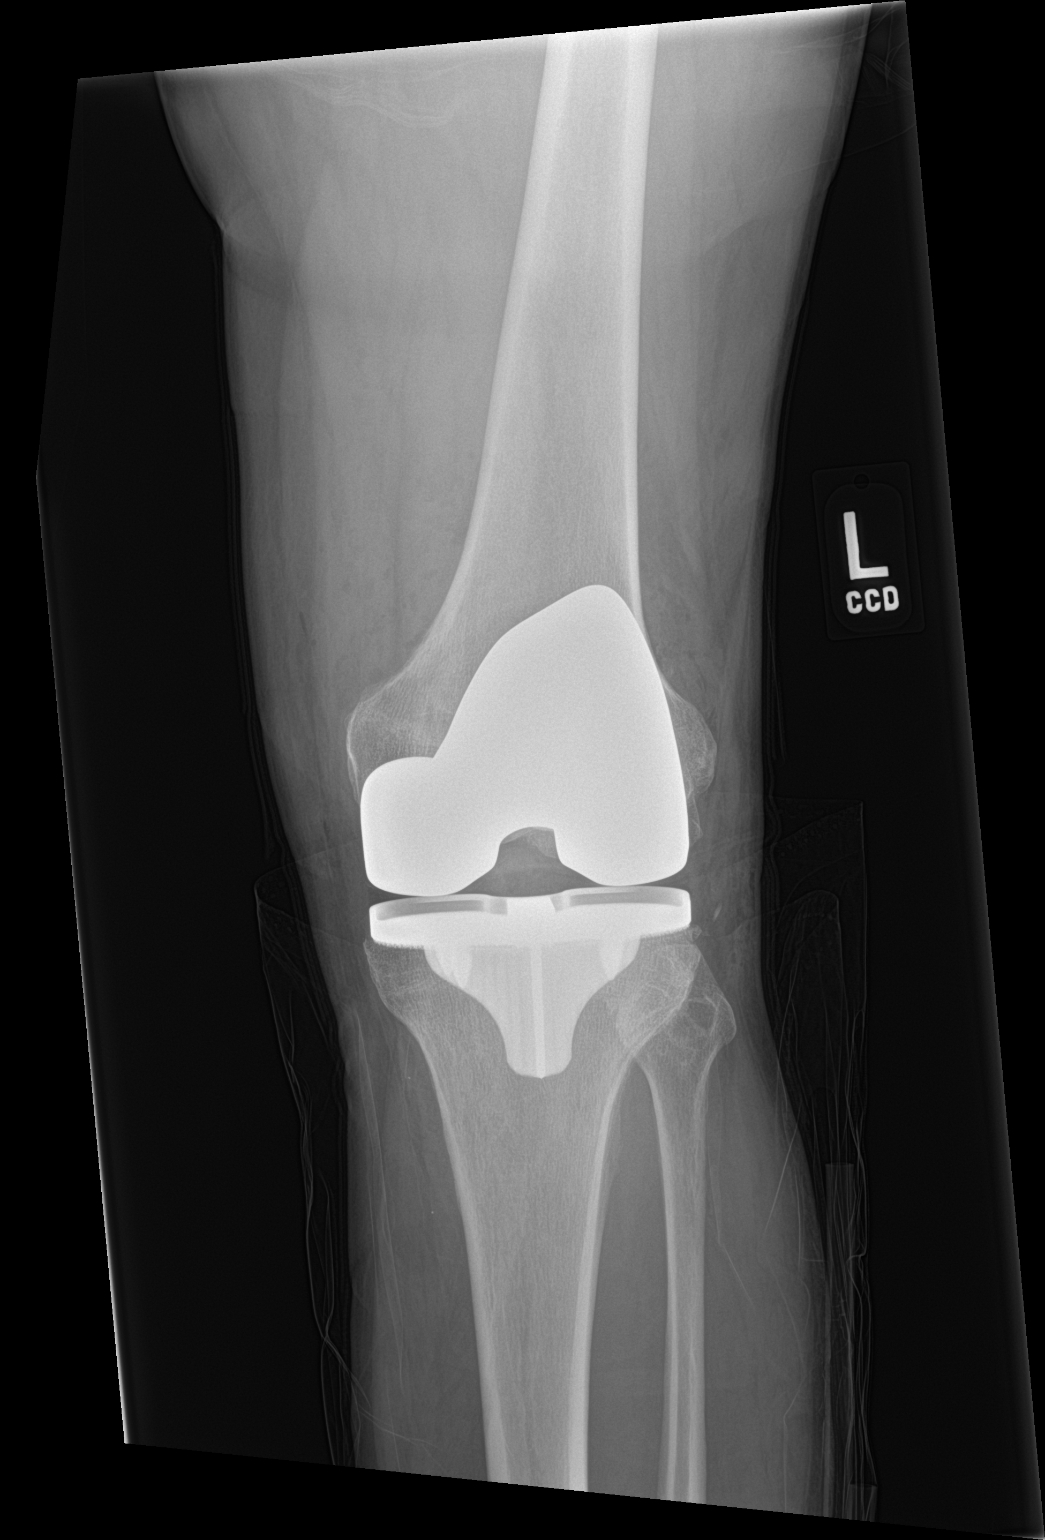

[knee lat]
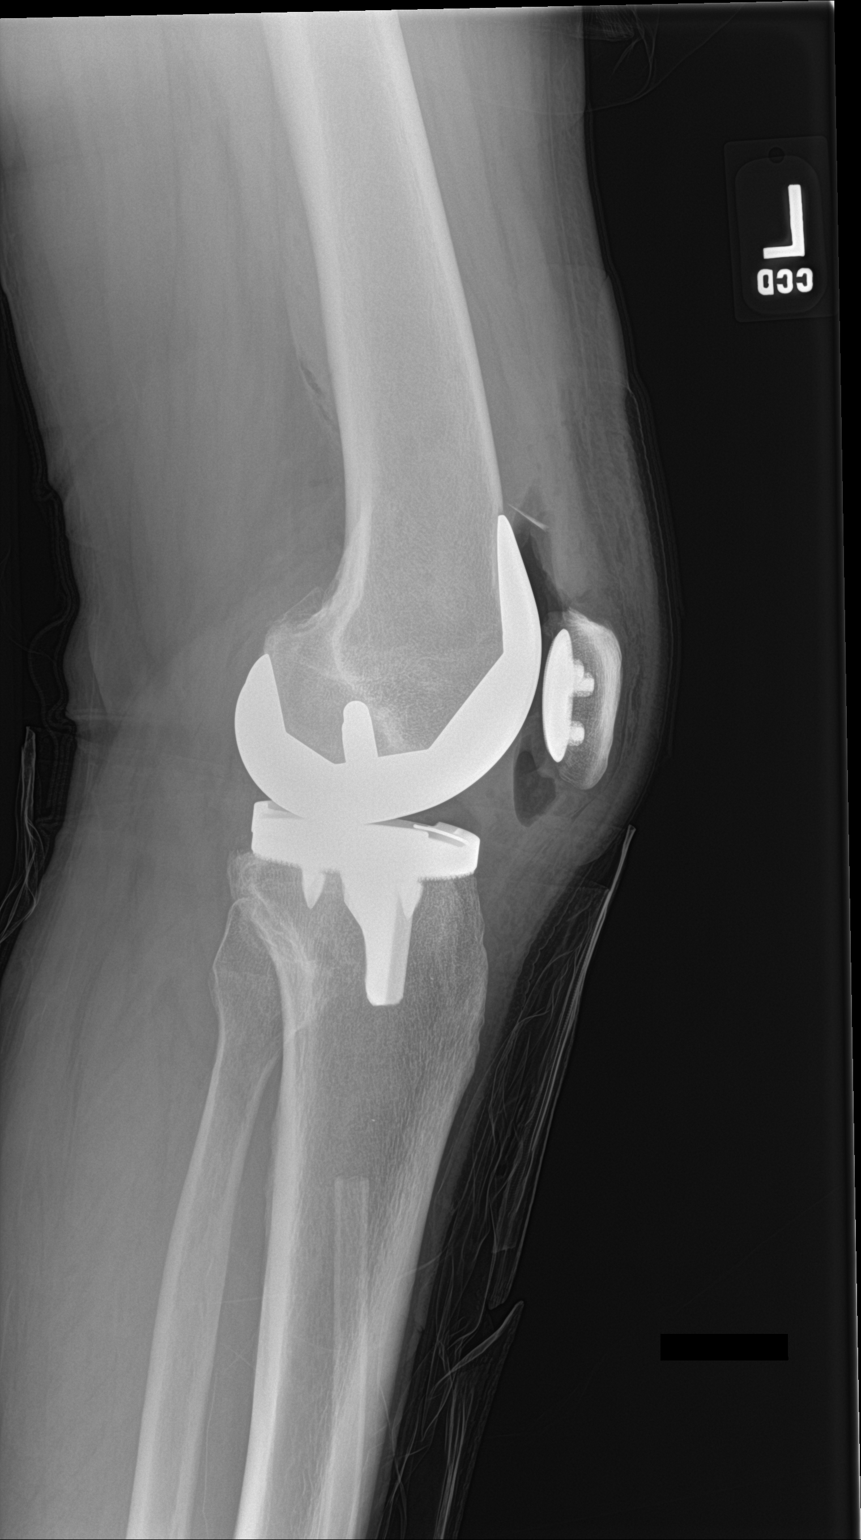

[2 of 2 positions shown; findings below may reference images not displayed]

FINDINGS: Left knee prosthesis is noted. There is a linear density noted along
the anterior aspect of femoral component superiorly which may
represent a small bone fragment from the remodeling of the distal
femur. Clinical correlation is recommended. No other focal
abnormality is noted..
IMPRESSION: Linear density adjacent to the femoral component of the prosthesis
as described. Clinical correlation is recommended.

## 2023-06-07 ENCOUNTER — Other Ambulatory Visit: Payer: Self-pay | Admitting: Urology

## 2023-06-07 DIAGNOSIS — R972 Elevated prostate specific antigen [PSA]: Secondary | ICD-10-CM

## 2023-07-24 ENCOUNTER — Ambulatory Visit
Admission: RE | Admit: 2023-07-24 | Discharge: 2023-07-24 | Disposition: A | Discharge status: Home / Self Care | Payer: BC Managed Care – PPO | Source: Ambulatory Visit | Attending: Urology | Admitting: Urology

## 2023-07-24 DIAGNOSIS — R972 Elevated prostate specific antigen [PSA]: Secondary | ICD-10-CM

## 2023-07-24 MED ORDER — GADOPICLENOL 0.5 MMOL/ML IV SOLN
9.0000 mL | Freq: Once | INTRAVENOUS | Status: AC | PRN
  Administered 2023-07-24: 9 mL via INTRAVENOUS
